# Patient Record
Sex: Male | Born: 1970 | Race: White | Hispanic: No | Marital: Married | Smoking: Former smoker
Health system: Southern US, Community
[De-identification: ages and names within clinical notes are randomized; demographics above are authoritative.]

---
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
## 2006-03-15 ENCOUNTER — Ambulatory Visit: Payer: Self-pay | Admitting: Otolaryngology

## 2012-11-26 ENCOUNTER — Ambulatory Visit: Payer: Self-pay | Admitting: Family Medicine

## 2014-06-29 ENCOUNTER — Ambulatory Visit: Payer: Self-pay | Admitting: Family Medicine

## 2014-07-01 ENCOUNTER — Ambulatory Visit: Payer: Commercial Indemnity | Admitting: General Surgery

## 2014-07-01 ENCOUNTER — Encounter: Payer: Self-pay | Admitting: General Surgery

## 2014-07-01 VITALS — BP 118/68 | HR 70 | Resp 12 | Ht 72.0 in | Wt 173.0 lb

## 2014-07-01 DIAGNOSIS — K8020 Calculus of gallbladder without cholecystitis without obstruction: Secondary | ICD-10-CM

## 2014-07-01 NOTE — Patient Instructions (Addendum)
Patient to have a ct scan done.  Laparoscopic Cholecystectomy Laparoscopic cholecystectomy is surgery to remove the gallbladder. The gallbladder is located in the upper right part of the abdomen, behind the liver. It is a storage sac for bile produced in the liver. Bile aids in the digestion and absorption of fats. Cholecystectomy is often done for inflammation of the gallbladder (cholecystitis). This condition is usually caused by a buildup of gallstones (cholelithiasis) in your gallbladder. Gallstones can block the flow of bile, resulting in inflammation and pain. In severe cases, emergency surgery may be required. When emergency surgery is not required, you will have time to prepare for the procedure. Laparoscopic surgery is an alternative to open surgery. Laparoscopic surgery has a shorter recovery time. Your common bile duct may also need to be examined during the procedure. If stones are found in the common bile duct, they may be removed. LET [facility] CARE PROVIDER KNOW ABOUT:  Any allergies you have.  All medicines you are taking, including vitamins, herbs, eye drops, creams, and over-the-counter medicines.  Previous problems you or members of your family have had with the use of anesthetics.  Any blood disorders you have.  Previous surgeries you have had.  Medical conditions you have. RISKS AND COMPLICATIONS Generally, this is a safe procedure. However, as with any procedure, complications can occur. Possible complications include:  Infection.  Damage to the common bile duct, nerves, arteries, veins, or other internal organs such as the stomach, liver, or intestines.  Bleeding.  A stone may remain in the common bile duct.  A bile leak from the cyst duct that is clipped when your gallbladder is removed.  The need to convert to open surgery, which requires a larger incision in the abdomen. This may be necessary if your surgeon thinks it is not safe to continue with a  laparoscopic procedure. BEFORE THE PROCEDURE  Ask your health care provider about changing or stopping any regular medicines. You will need to stop taking aspirin or blood thinners at least 5 days prior to surgery.  Do not eat or drink anything after midnight the night before surgery.  Let your health care provider know if you develop a cold or other infectious problem before surgery. PROCEDURE   You will be given medicine to make you sleep through the procedure (general anesthetic). A breathing tube will be placed in your mouth.  When you are asleep, your surgeon will make several small cuts (incisions) in your abdomen.  A thin, lighted tube with a tiny camera on the end (laparoscope) is inserted through one of the small incisions. The camera on the laparoscope sends a picture to a TV screen in the operating room. This gives the surgeon a good view inside your abdomen.  A gas will be pumped into your abdomen. This expands your abdomen so that the surgeon has more room to perform the surgery.  Other tools needed for the procedure are inserted through the other incisions. The gallbladder is removed through one of the incisions.  After the removal of your gallbladder, the incisions will be closed with stitches, staples, or skin glue. AFTER THE PROCEDURE  You will be taken to a recovery area where your progress will be checked often.  You may be allowed to go home the same day if your pain is controlled and you can tolerate liquids. Document Released: 07/24/2005 Document Revised: 05/14/2013 Document Reviewed: 03/05/2013 [facility] Patient Information 2015 [place], [place]. This information is not intended to replace advice given  to you by your health care provider. Make sure you discuss any questions you have with your health care provider.  Patient has been scheduled for a CT abdomen/pelvis with contrast at [facility] for 07-06-14 at 2:30 pm (arrive 2:15 pm). Prep: no solids 4 hours prior but  patient may have clear liquids up until exam time, pick up prep kit, and take medication list. Patient verbalizes understanding.  Patient's surgery has been scheduled for 07-07-14 at [facility].

## 2014-07-01 NOTE — Progress Notes (Signed)
Patient ID: Logan Minium Sr., male   DOB: December 01, 1970, 43 y.o.   MRN: [id number]  Chief Complaint  Patient presents with  . Abdominal Pain    HPI Logan Nesbit Sr. is a 43 y.o. male  Here today for a evaluation of his gallbladder. Patient states he has been having pain in his mid abdomen and some right abd area since Friday 06/26/14.  Marland Kitchen He states the pain is sharpe at times .He had a ultrasound done on 06/29/14. He states when he eats pain is worse after about 30 mins, feels like he has to move his bowel. It is now day 4 and he is still having some pain with exacerbations when eating. No fevr or chills. No n/v  HPI  History reviewed. No pertinent past medical history.  Past Surgical History  Procedure Laterality Date  . Appendectomy  1980    Family History  Problem Relation Age of Onset  . Prostate cancer Father     Social History History  Substance Use Topics  . Smoking status: Former Smoker -- 1.00 packs/day for 20 years    Types: Cigarettes  . Smokeless tobacco: Never Used  . Alcohol Use: No    No Known Allergies  Current Outpatient Prescriptions  Medication Sig Dispense Refill  . HYDROcodone-acetaminophen (NORCO/VICODIN) 5-325 MG per tablet Take 2 tablets by mouth every 6 (six) hours as needed.     Marland Kitchen ibuprofen (ADVIL,MOTRIN) 200 MG tablet Take 200 mg by mouth every 6 (six) hours as needed.    Marland Kitchen levofloxacin (LEVAQUIN) 500 MG tablet Take 500 mg by mouth daily.      No current facility-administered medications for this visit.    Review of Systems Review of Systems  Constitutional: Positive for appetite change. Negative for fever, chills, diaphoresis, activity change, fatigue and unexpected weight change.  Respiratory: Negative.   Cardiovascular: Negative.   Gastrointestinal: Positive for abdominal pain and diarrhea. Negative for nausea, vomiting, constipation, blood in stool, anal bleeding and rectal pain.    Blood pressure 118/68, pulse 70, resp. rate  12, height 6' (1.829 m), weight 173 lb (78.472 kg).  Physical Exam Physical Exam  Constitutional: He is oriented to person, place, and time. He appears well-developed and well-nourished.  Eyes: Conjunctivae are normal. No scleral icterus.  Neck: Neck supple.  Cardiovascular: Normal rate, regular rhythm and normal heart sounds.   Pulmonary/Chest: Effort normal and breath sounds normal.  Abdominal: Soft. Normal appearance and bowel sounds are normal. There is no hepatomegaly. There is tenderness.    Lymphadenopathy:    He has no cervical adenopathy.  Neurological: He is alert and oriented to person, place, and time.  Skin: Skin is dry.    Data Reviewed Notes and ultrasound reviewed. Tiny stones and sludge seen. No GBW thickening, no pericholecystic fluid. LFTS were normal.  Assessment    Likely his symptoms are from gallbladder but it is atypical in character for biliary colic. Laparoscopy cholecystectomy discussed and he is agreeable. Also requested CT abd/pelvis before surgery due to atypical symptoms. If this is negative for any pother significant finding will proceed with cholecystectomy.  Procedure, risks and benefits explained to him. He is agreeable with the plan.    Plan       Discussed gallbladder surgery.   Patient has been scheduled for a CT abdomen/pelvis with contrast at [facility] for 07-06-14 at 2:30 pm (arrive 2:15 pm). Prep: no solids 4 hours prior but patient may have clear liquids up until exam time,  pick up prep kit, and take medication list. Patient verbalizes understanding.  Patient's surgery has been scheduled for 07-07-14 at [facility].     Kron Everton G 07/01/2014, 3:07 PM

## 2014-07-06 ENCOUNTER — Telehealth: Payer: Self-pay | Admitting: General Surgery

## 2014-07-06 ENCOUNTER — Encounter: Payer: Self-pay | Admitting: General Surgery

## 2014-07-06 ENCOUNTER — Ambulatory Visit: Payer: Self-pay | Admitting: General Surgery

## 2014-07-06 NOTE — Telephone Encounter (Signed)
CT of abdomen was normal.  Pt advised. As planned will proceed with lap/cholecystectomy tomorrow.

## 2014-07-07 ENCOUNTER — Encounter: Payer: Self-pay | Admitting: General Surgery

## 2014-07-07 ENCOUNTER — Ambulatory Visit: Payer: Self-pay | Admitting: General Surgery

## 2014-07-07 DIAGNOSIS — K8010 Calculus of gallbladder with chronic cholecystitis without obstruction: Secondary | ICD-10-CM

## 2014-07-08 ENCOUNTER — Encounter: Payer: Self-pay | Admitting: General Surgery

## 2014-07-10 ENCOUNTER — Encounter: Payer: Self-pay | Admitting: General Surgery

## 2014-07-20 ENCOUNTER — Ambulatory Visit (INDEPENDENT_AMBULATORY_CARE_PROVIDER_SITE_OTHER): Payer: Self-pay | Admitting: General Surgery

## 2014-07-20 ENCOUNTER — Encounter: Payer: Self-pay | Admitting: General Surgery

## 2014-07-20 VITALS — BP 128/72 | HR 72 | Resp 12 | Ht 72.0 in | Wt 180.0 lb

## 2014-07-20 DIAGNOSIS — K8020 Calculus of gallbladder without cholecystitis without obstruction: Secondary | ICD-10-CM

## 2014-07-20 NOTE — Progress Notes (Signed)
Patient ID: Logan LucyMichael Allen Hannula Sr., male   DOB: 05/09/1971, 43 y.o.   MRN: [id number]   The patient is here today for a post op laprascopic cholecystectomy. The procedure was performed on 07/07/14. The patient is doing well. No complaints at this time. He does feel that preoperative pain has mostly resolved. Pathology report showed chronic cholecystitis.   Port sites healed well. Abdomen soft, lungs are clean. Patient to return as needed.

## 2014-07-20 NOTE — Patient Instructions (Signed)
The patient is aware to call back for any questions or concerns.  

## 2014-11-28 NOTE — Op Note (Signed)
PATIENT NAME:  Logan BushmanBUCK, Jacy A MR#:  [id number] DATE OF BIRTH:  [birth year]  DATE OF PROCEDURE:  07/07/2014  PREOPERATIVE DIAGNOSIS: Chronic cholecystitis and cholelithiasis.   POSTOPERATIVE DIAGNOSIS: Chronic cholecystitis and cholelithiasis.   OPERATION: Laparoscopy and cholecystectomy with intraoperative cholangiogram.   ANESTHESIA: General.   COMPLICATIONS: None.   ESTIMATED BLOOD LOSS: Less than 20 mL.   DRAINS: None.   DESCRIPTION OF PROCEDURE: The patient was put to sleep in the supine position on the operating table. The abdomen was prepped and draped out as a sterile field. Timeout was performed. Initial port site was placed at the umbilicus where a small incision was made and a Veress needle with InnerDyne sleeve was positioned in the peritoneal cavity and verified with the hanging drop method. Pneumoperitoneum was obtained and 10 mm port was placed. With the camera in place epigastric and 2 lateral 5 mm ports were placed. The gallbladder was noted to be mildly thickened in its wall and had a slight amount of edema suggestive of subacute process. With careful cephalad retraction on the fundus the Hartmann pouch area was pulled up and the cystic duct was dissected free. The Kumar clamp and catheter was positioned and cholangiogram was performed which showed a narrow cystic duct connected to a normal-appearing bile duct, this filled both proximally and distally with no evidence of filling defects or obstruction to flow. The catheter was used to decompress the gallbladder slightly and removed. The cystic duct was hemoclipped and cut. The cystic artery was identified, freed, hemoclipped, and cut, and the gallbladder was dissected free from its bed using cautery for control of bleeding. A small amount of fluid was used to irrigate out the area in the right upper quadrant in the gallbladder bed and suctioned out. A 5 mm scope was used in the epigastric port site, the gallbladder was brought out  through the umbilical port site. It was noted to contain tiny flecks of stones. The fascial opening at the umbilicus was closed with 0 Vicryl using a suture passer and tied down. Pneumoperitoneum was released and the remaining ports were removed. Skin incisions were closed with subcuticular 4-0 Vicryl, reinforced with Steri-Strips and dry sterile dressing was placed. The patient tolerated the procedure well. There were no immediate problems. He was subsequently extubated and returned to the recovery room in stable condition.    ____________________________ S.Wynona LunaG. Wilhelmina Hark, MD sgs:bu D: 07/07/2014 18:03:52 ET T: 07/07/2014 18:29:14 ET JOB#: [id number]  cc: S.G. Evette CristalSankar, MD, <Dictator> [facility] Center[facility]PLAPUTH Wynona LunaG Suzanna Zahn MD ELECTRONICALLY SIGNED 07/08/2014 10:11

## 2014-11-30 LAB — SURGICAL PATHOLOGY

## 2015-06-14 IMAGING — CT CT ABD-PELV W/ CM
2 of 5 series · 17 of 46 positions shown, 19 images · IV contrast (isovue)
Comparison: None.

CLINICAL DATA: Right lower quadrant pain, left lower quadrant pain
for 1 week

EXAM:
CT ABDOMEN AND PELVIS WITH CONTRAST
TECHNIQUE: Multidetector CT imaging of the abdomen and pelvis was performed
using the standard protocol following bolus administration of
intravenous contrast.
CONTRAST:  100 mL Isovue 370

[Series 2: routine abd pel with · axial · 0.71mm/px · z∈[-478,-78]mm · 14 of 91 slices shown, 16 images]
[im 6/91  soft-tissue]
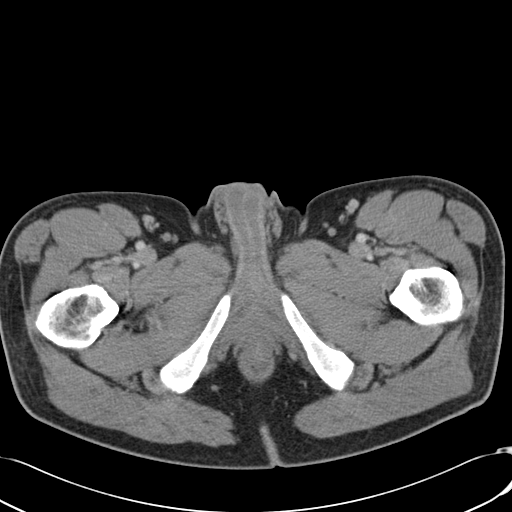
[im 6/91  bone]
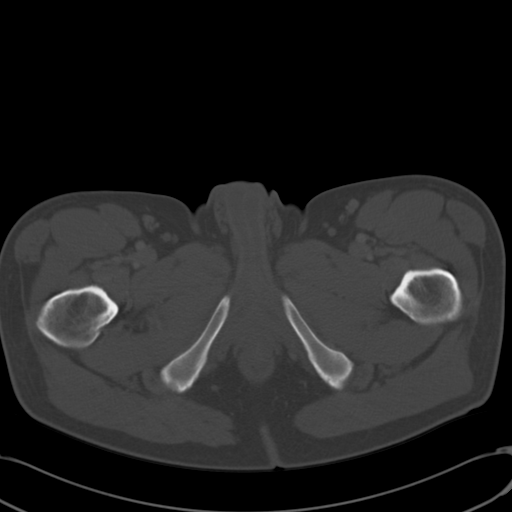
[im 11/91  soft-tissue]
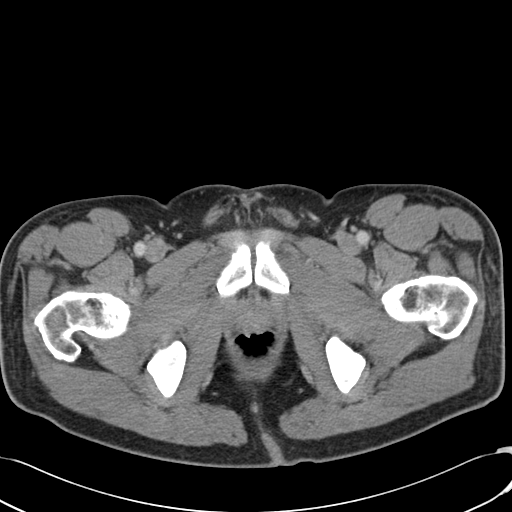
[im 21/91  soft-tissue]
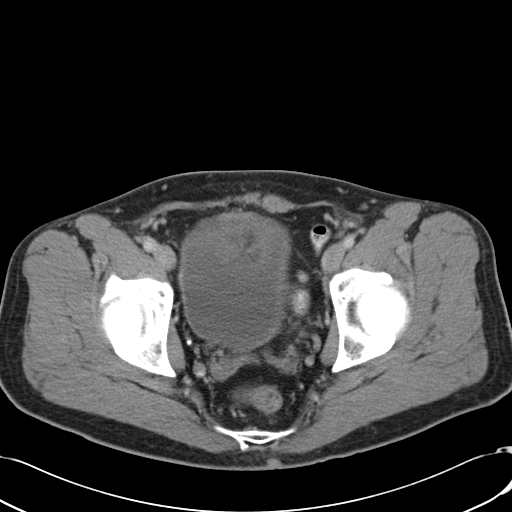
[im 26/91  soft-tissue]
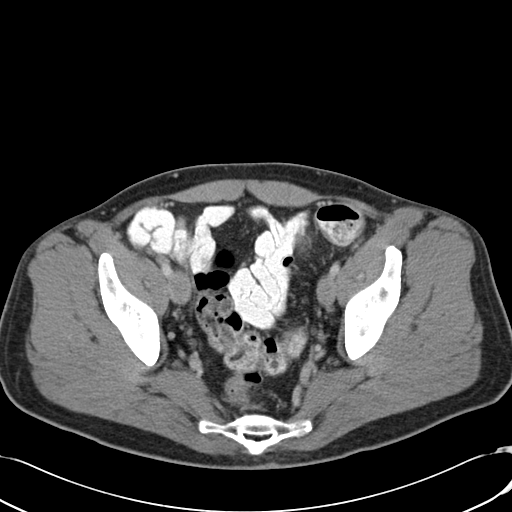
[im 31/91  soft-tissue]
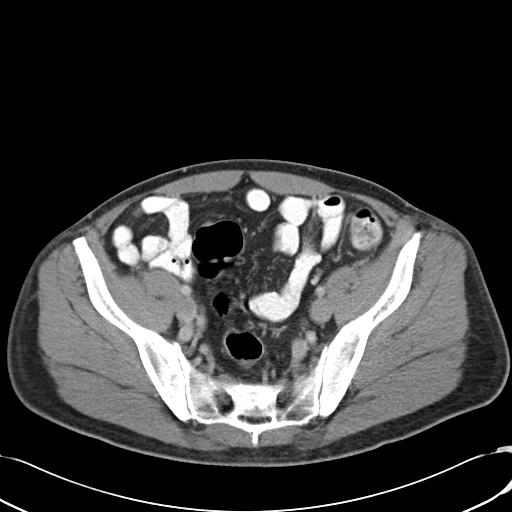
[im 36/91  soft-tissue]
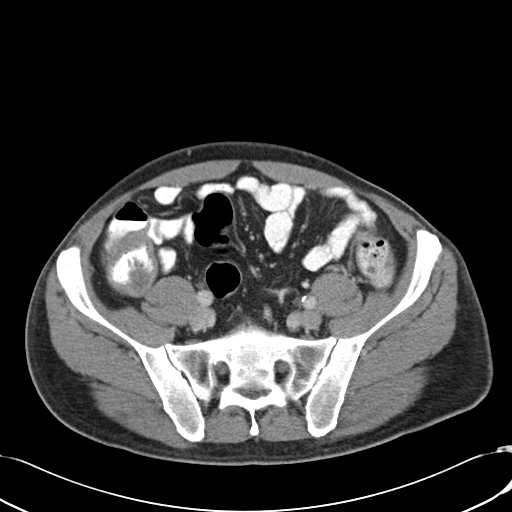
[im 41/91  soft-tissue]
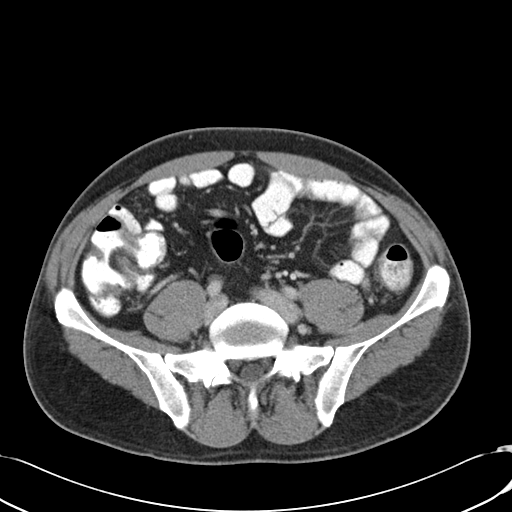
[im 51/91  soft-tissue]
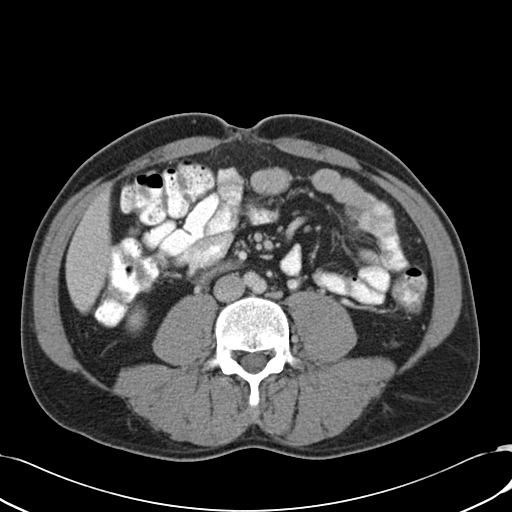
[im 56/91  soft-tissue]
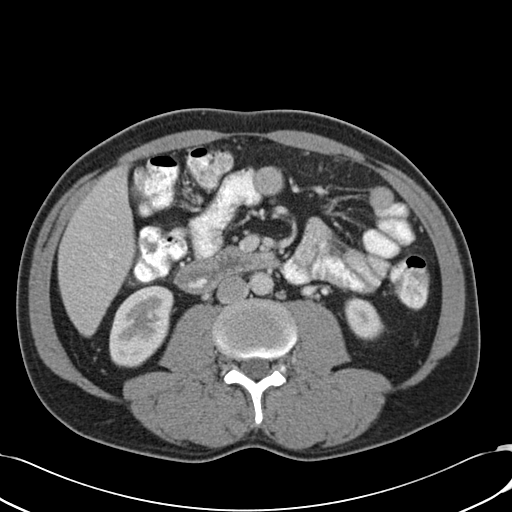
[im 56/91  bone]
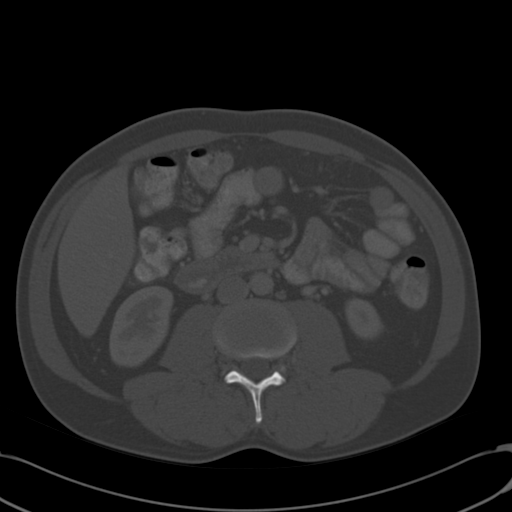
[im 61/91  soft-tissue]
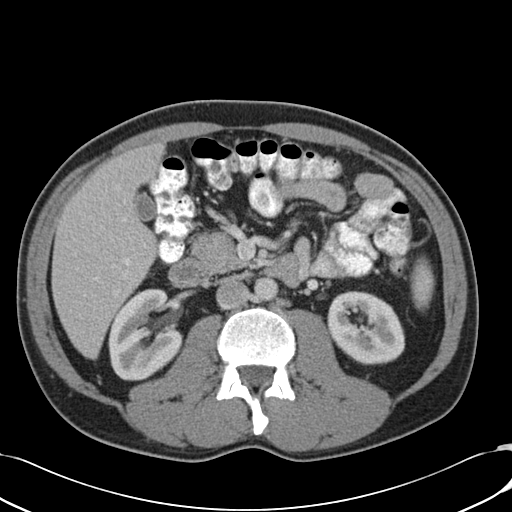
[im 66/91  soft-tissue]
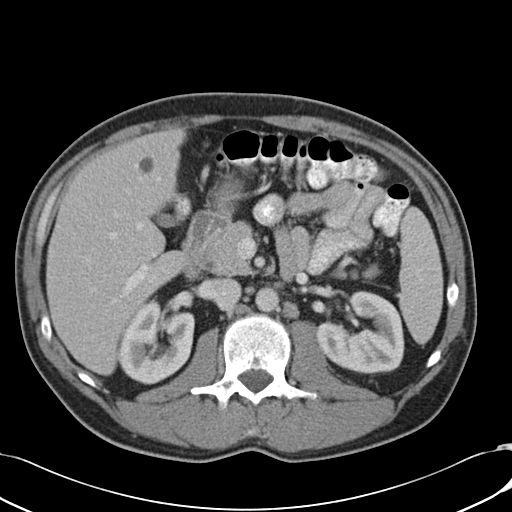
[im 71/91  soft-tissue]
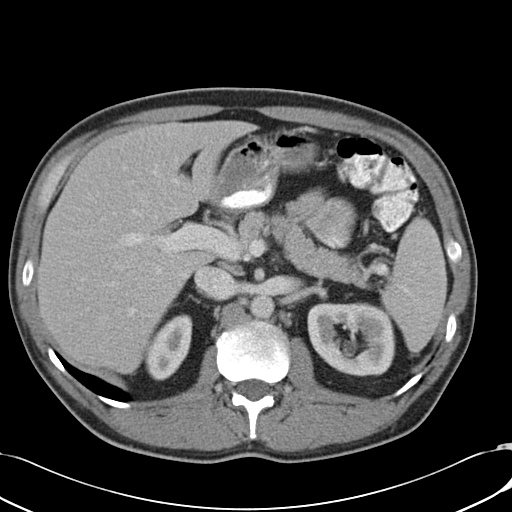
[im 81/91  soft-tissue]
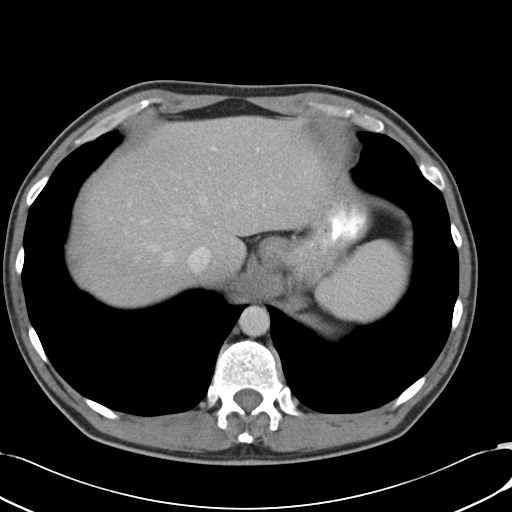
[im 86/91  soft-tissue]
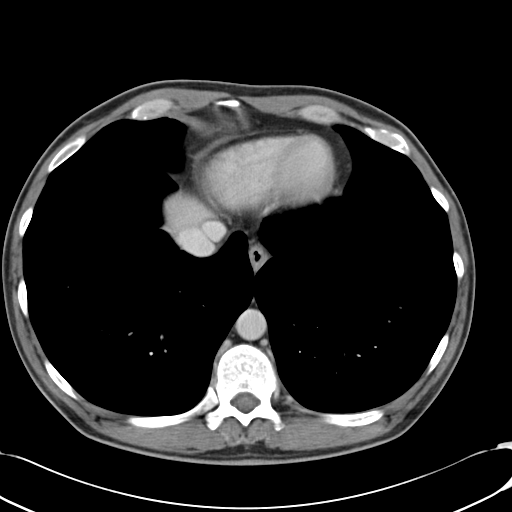

[Series 5: cor routine abd pel with · coronal · 0.71mm/px · 3 of 145 slices shown]
[im 49/145  soft-tissue]
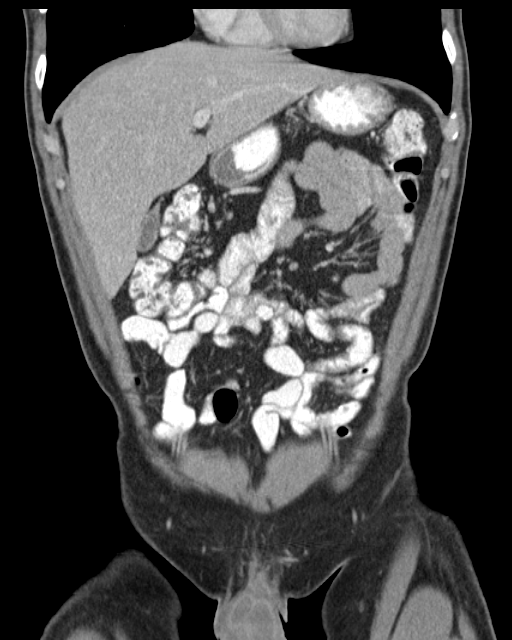
[im 65/145  soft-tissue]
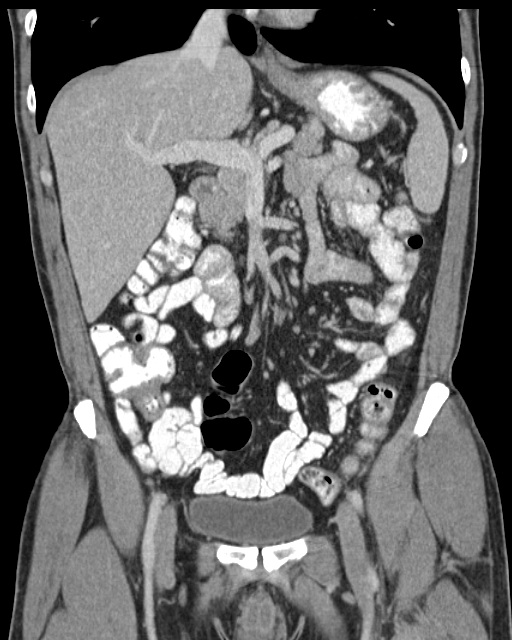
[im 81/145  soft-tissue]
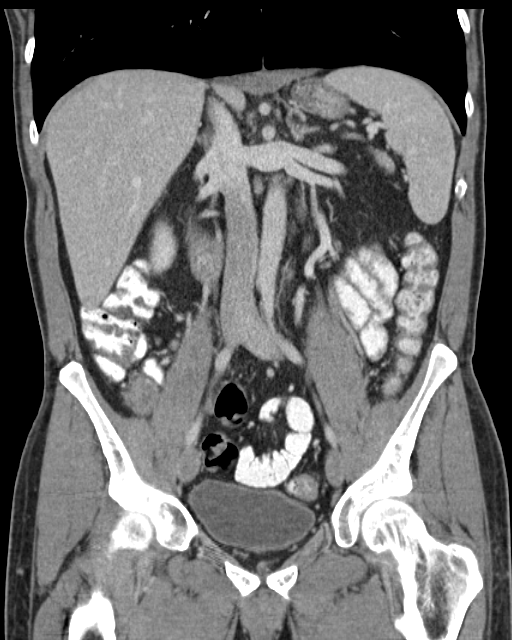

[17 of 46 positions shown; findings below may reference images not displayed]

FINDINGS: The lung bases are clear.

There is a 1.1 x 1.1 cm hypodense, fluid attenuating right hepatic
mass most consistent with a small cyst. The liver is otherwise
normal. There is no intrahepatic or extrahepatic biliary ductal
dilatation. The gallbladder is normal. The spleen demonstrates no
focal abnormality. The kidneys, adrenal glands and pancreas are
normal. The bladder is unremarkable.

The stomach, duodenum, small intestine, and large intestine
demonstrate no contrast extravasation or dilatation. There is no
pneumoperitoneum, pneumatosis, or portal venous gas. There is no
abdominal or pelvic free fluid. There is no lymphadenopathy.

The abdominal aorta is normal in caliber.

There are no lytic or sclerotic osseous lesions.
IMPRESSION: 1. No acute abdominal or pelvic pathology.

## 2015-06-15 IMAGING — CR DG CHOLANGIOGRAM OPERATIVE
1 series · 3 of 3 positions shown · non-contrast
Comparison: None.

CLINICAL DATA: Cholelithiasis

EXAM:
INTRAOPERATIVE CHOLANGIOGRAM
TECHNIQUE: Cholangiographic images from the C-arm fluoroscopic device were
submitted for interpretation post-operatively. Please see the
procedural report for the amount of contrast and the fluoroscopy
time utilized.

[Series 6001: s1 · 3 of 3 slices shown]
[im 1/3]
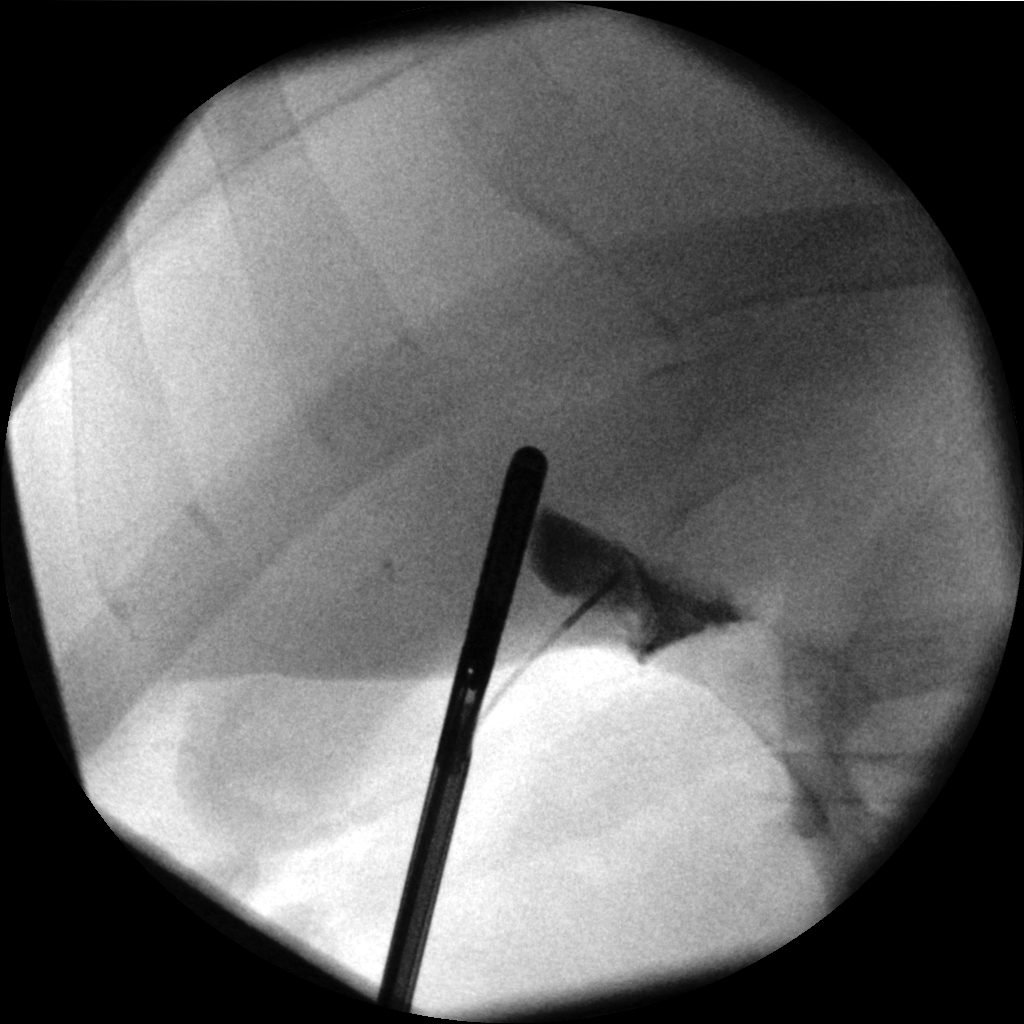
[im 2/3]
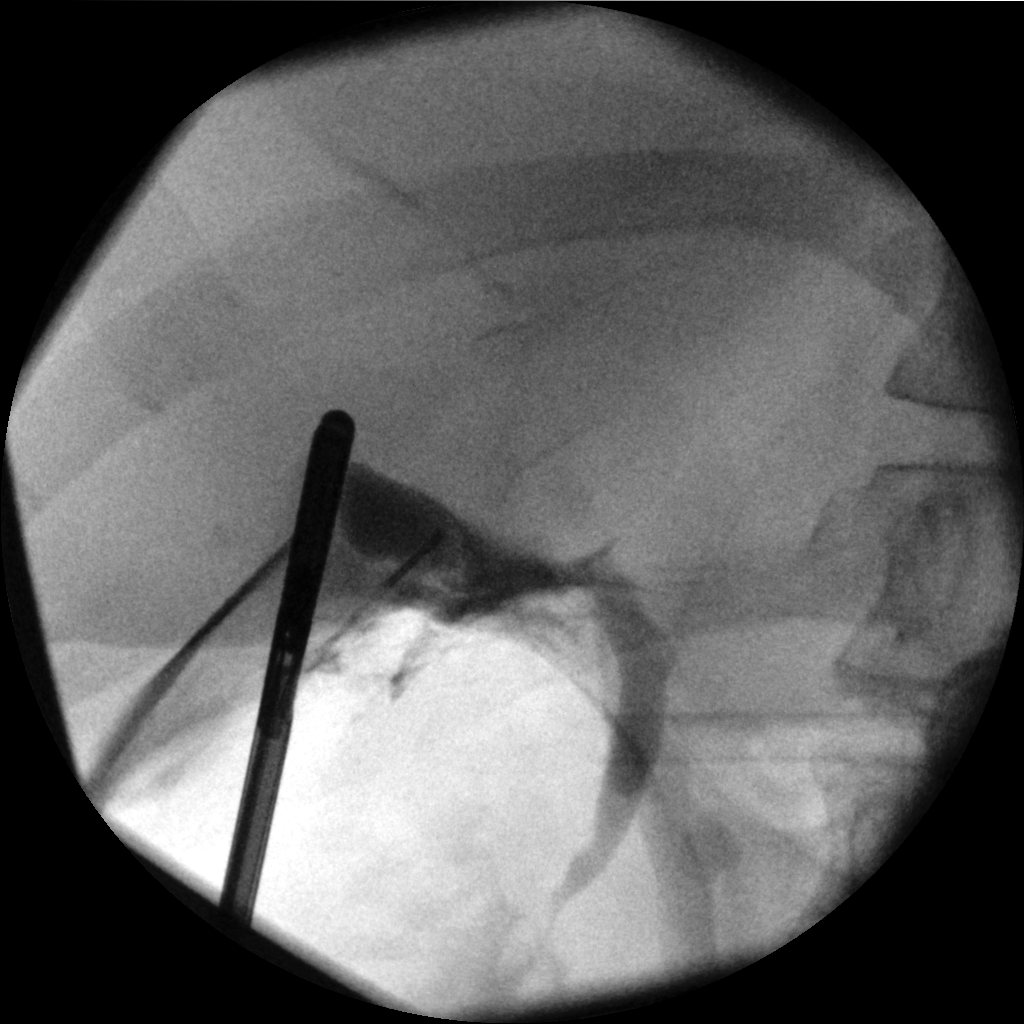
[im 3/3]
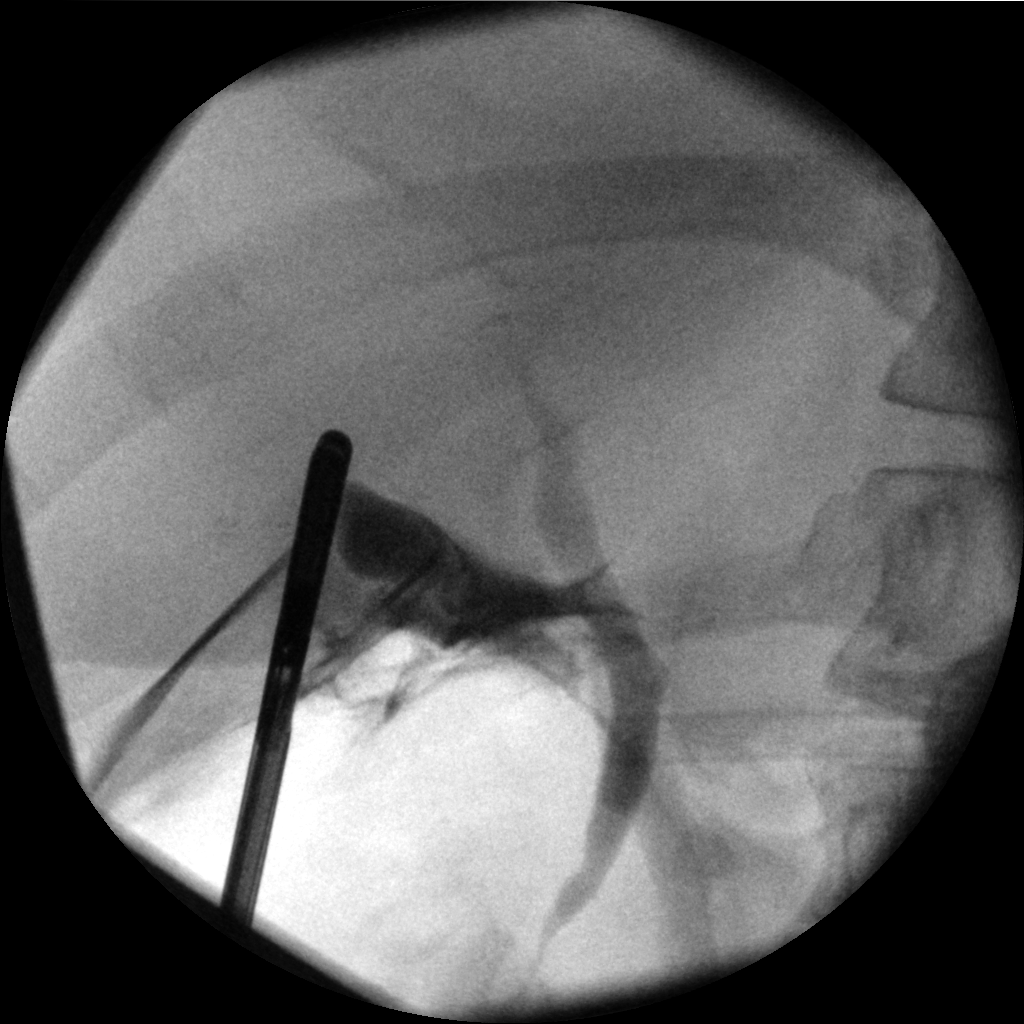

[3 of 3 positions shown; findings below may reference images not displayed]

FINDINGS: No persistent filling defects in the common duct. Intrahepatic ducts
are incompletely visualized, appearing decompressed centrally.
Contrast passes into the duodenum.

:
Negative for retained common duct stone.
